# Patient Record
Sex: Male | Born: 1995 | Race: White | Hispanic: No | Marital: Single | Smoking: Never smoker
Health system: Southern US, Community
[De-identification: ages and names within clinical notes are randomized; demographics above are authoritative.]

## PROBLEM LIST (undated history)

## (undated) DIAGNOSIS — F419 Anxiety disorder, unspecified: Secondary | ICD-10-CM

---
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
## 2017-04-27 ENCOUNTER — Emergency Department (HOSPITAL_COMMUNITY): Payer: BLUE CROSS/BLUE SHIELD

## 2017-04-27 ENCOUNTER — Emergency Department (HOSPITAL_COMMUNITY)
Admission: EM | Admit: 2017-04-27 | Discharge: 2017-04-27 | Disposition: A | Discharge status: Home / Self Care | Payer: BLUE CROSS/BLUE SHIELD | Attending: Emergency Medicine | Admitting: Emergency Medicine

## 2017-04-27 ENCOUNTER — Encounter (HOSPITAL_COMMUNITY): Payer: Self-pay | Admitting: Emergency Medicine

## 2017-04-27 DIAGNOSIS — I10 Essential (primary) hypertension: Secondary | ICD-10-CM | POA: Diagnosis not present

## 2017-04-27 DIAGNOSIS — R1011 Right upper quadrant pain: Secondary | ICD-10-CM | POA: Diagnosis present

## 2017-04-27 DIAGNOSIS — R112 Nausea with vomiting, unspecified: Secondary | ICD-10-CM | POA: Diagnosis not present

## 2017-04-27 LAB — URINALYSIS, ROUTINE W REFLEX MICROSCOPIC
BILIRUBIN URINE: NEGATIVE
GLUCOSE, UA: NEGATIVE mg/dL
HGB URINE DIPSTICK: NEGATIVE
KETONES UR: 20 mg/dL — AB
Leukocytes, UA: NEGATIVE
Nitrite: NEGATIVE
PH: 8 (ref 5.0–8.0)
PROTEIN: NEGATIVE mg/dL
Specific Gravity, Urine: 1.021 (ref 1.005–1.030)

## 2017-04-27 LAB — LIPASE, BLOOD: LIPASE: 20 U/L (ref 11–51)

## 2017-04-27 LAB — COMPREHENSIVE METABOLIC PANEL
ALBUMIN: 4.5 g/dL (ref 3.5–5.0)
ALT: 24 U/L (ref 17–63)
AST: 24 U/L (ref 15–41)
Alkaline Phosphatase: 71 U/L (ref 38–126)
Anion gap: 11 (ref 5–15)
BUN: 19 mg/dL (ref 6–20)
CHLORIDE: 102 mmol/L (ref 101–111)
CO2: 24 mmol/L (ref 22–32)
CREATININE: 0.98 mg/dL (ref 0.61–1.24)
Calcium: 9.6 mg/dL (ref 8.9–10.3)
GFR calc Af Amer: >60 mL/min (ref >60)
GLUCOSE: 125 mg/dL — AB (ref 65–99)
POTASSIUM: 3.5 mmol/L (ref 3.5–5.1)
Sodium: 137 mmol/L (ref 135–145)
Total Bilirubin: 0.9 mg/dL (ref 0.3–1.2)
Total Protein: 7.6 g/dL (ref 6.5–8.1)

## 2017-04-27 LAB — RAPID URINE DRUG SCREEN, HOSP PERFORMED
Amphetamines: NOT DETECTED
BARBITURATES: NOT DETECTED
BENZODIAZEPINES: NOT DETECTED
Cocaine: NOT DETECTED
Opiates: NOT DETECTED
Tetrahydrocannabinol: POSITIVE — AB

## 2017-04-27 LAB — CBC
HEMATOCRIT: 41.9 % (ref 39.0–52.0)
Hemoglobin: 14.2 g/dL (ref 13.0–17.0)
MCH: 29.5 pg (ref 26.0–34.0)
MCHC: 33.9 g/dL (ref 30.0–36.0)
MCV: 87.1 fL (ref 78.0–100.0)
PLATELETS: 265 10*3/uL (ref 150–400)
RBC: 4.81 MIL/uL (ref 4.22–5.81)
RDW: 13.3 % (ref 11.5–15.5)
WBC: 10.7 10*3/uL — ABNORMAL HIGH (ref 4.0–10.5)

## 2017-04-27 MED ORDER — SODIUM CHLORIDE 0.9 % IV BOLUS (SEPSIS)
1000.0000 mL | Freq: Once | INTRAVENOUS | Status: AC
  Administered 2017-04-27: 1000 mL via INTRAVENOUS

## 2017-04-27 MED ORDER — ONDANSETRON 4 MG PO TBDP
4.0000 mg | ORAL_TABLET | Freq: Once | ORAL | Status: AC | PRN
  Administered 2017-04-27: 4 mg via ORAL
  Filled 2017-04-27: qty 1

## 2017-04-27 MED ORDER — HYDROCODONE-ACETAMINOPHEN 5-325 MG PO TABS: 1.0000 | ORAL_TABLET | ORAL | 0 refills | Status: AC | PRN

## 2017-04-27 MED ORDER — HYDROMORPHONE HCL 1 MG/ML IJ SOLN
1.0000 mg | Freq: Once | INTRAMUSCULAR | Status: AC
  Administered 2017-04-27: 1 mg via INTRAVENOUS
  Filled 2017-04-27: qty 1

## 2017-04-27 MED ORDER — ONDANSETRON HCL 8 MG PO TABS: 8.0000 mg | ORAL_TABLET | Freq: Three times a day (TID) | ORAL | 0 refills | Status: AC | PRN

## 2017-04-27 MED ORDER — ONDANSETRON HCL 4 MG/2ML IJ SOLN
4.0000 mg | Freq: Once | INTRAMUSCULAR | Status: AC
  Administered 2017-04-27: 4 mg via INTRAVENOUS
  Filled 2017-04-27: qty 2

## 2017-04-27 NOTE — ED Triage Notes (Signed)
Pt complaint of abd cramping with associated n/v onset 0330.

## 2017-04-27 NOTE — ED Provider Notes (Signed)
[facility]-EMERGENCY DEPT Provider Note   CSN: [id number] Arrival date & time: 04/27/17  4098     History   Chief Complaint Chief Complaint  Patient presents with  . Abdominal Pain    HPI [name] is a 21 y.o. male.  The patient presents for evaluation of right upper quadrant abdominal pain which has been recurring over the last 2 weeks.  Currently the pain is been present since last night and is associated with numerous episodes of vomiting, yellow material.  He has not seen any blood in his emesis.  He denies diarrhea, constipation, fever, chills, weakness or dizziness.  No prior similar problems.  The pain in his right upper quadrant is severe rated at 9/10.  No known sick contacts.  No specific treatments tried yet.  There are no other known modifying factors.  HPI  Past Medical History:  Diagnosis Date  . Anxiety     There are no active problems to display for this patient.   History reviewed. No pertinent surgical history.     Home Medications    Prior to Admission medications   Medication Sig Start Date End Date Taking? Authorizing Provider  citalopram (CELEXA) 40 MG tablet Take 40 mg at bedtime by mouth.   Yes [provider]  ibuprofen (ADVIL,MOTRIN) 200 MG tablet Take 400 mg by mouth every 6 (six) hours as needed. For pain.    Yes [provider]    Family History Family History  Problem Relation Age of Onset  . Hypertension Father   . Diabetes Other     Social History Social History   Tobacco Use  . Smoking status: Never Smoker  Substance Use Topics  . Alcohol use: No  . Drug use: No     Allergies   Mold extract [trichophyton] and Pollen extract   Review of Systems Review of Systems  All other systems reviewed and are negative.    Physical Exam Updated Vital Signs BP (!) 142/88   Pulse 65   Temp 98.1 F (36.7 C) (Oral)   Resp 16   Ht 5\' 11"  (1.803 m)   Wt 108.9 kg (240 lb)   SpO2 100%    BMI 33.47 kg/m   Physical Exam  Constitutional: He is oriented to person, place, and time. He appears well-developed and well-nourished. He does not appear ill.  HENT:  Head: Normocephalic and atraumatic.  Right Ear: External ear normal.  Left Ear: External ear normal.  Eyes: Conjunctivae and EOM are normal. Pupils are equal, round, and reactive to light.  Neck: Normal range of motion and phonation normal. Neck supple.  Cardiovascular: Normal rate, regular rhythm and normal heart sounds.  Pulmonary/Chest: Effort normal and breath sounds normal. He exhibits no bony tenderness.  Abdominal: Soft. Bowel sounds are normal. There is no hepatosplenomegaly. There is tenderness (RUQ, mild). There is no rigidity, no rebound, no guarding and no CVA tenderness. Hernia confirmed negative in the ventral area.  Musculoskeletal: Normal range of motion.  Neurological: He is alert and oriented to person, place, and time. No cranial nerve deficit or sensory deficit. He exhibits normal muscle tone. Coordination normal.  Skin: Skin is warm, dry and intact.  Psychiatric: He has a normal mood and affect. His behavior is normal. Judgment and thought content normal.  Nursing note and vitals reviewed.    ED Treatments / Results  Labs (all labs ordered are listed, but only abnormal results are displayed) Labs Reviewed  COMPREHENSIVE METABOLIC PANEL -  Abnormal; Notable for the following components:      Result Value   Glucose, Bld 125 (*)    All other components within normal limits  CBC - Abnormal; Notable for the following components:   WBC 10.7 (*)    All other components within normal limits  URINALYSIS, ROUTINE W REFLEX MICROSCOPIC - Abnormal; Notable for the following components:   Ketones, ur 20 (*)    All other components within normal limits  RAPID URINE DRUG SCREEN, HOSP PERFORMED - Abnormal; Notable for the following components:   Tetrahydrocannabinol POSITIVE (*)    All other components  within normal limits  LIPASE, BLOOD    EKG  EKG Interpretation None       Radiology [place] Abdomen Complete  Result Date: 04/27/2017 CLINICAL DATA:  One week of nausea vomiting and right upper quadrant pain. EXAM: ABDOMEN ULTRASOUND COMPLETE COMPARISON:  None in PACs FINDINGS: Gallbladder: No gallstones or wall thickening visualized. No sonographic Murphy sign noted by sonographer. Common bile duct: Diameter: 2.5 mm Liver: No focal lesion identified. Within normal limits in parenchymal echogenicity. Portal vein is patent on color Doppler imaging with normal direction of blood flow towards the liver. IVC: No abnormality visualized. Pancreas: Visualized portion unremarkable. Spleen: Size and appearance within normal limits. Right Kidney: Length: 11.3 cm. Echogenicity within normal limits. No mass or hydronephrosis visualized. Left Kidney: Length: 11.9 cm. Echogenicity within normal limits. No mass or hydronephrosis visualized. Abdominal aorta: No aneurysm visualized. Other findings: No ascites is observed. IMPRESSION: No gallstones or sonographic evidence of acute cholecystitis. If there are clinical concerns of chronic cholecystitis, a nuclear medicine hepatobiliary scan with gallbladder ejection fraction determination may be useful. No acute intra-abdominal abnormality is observed. Electronically Signed   By: David  SwazilandJordan M.D.   On: 04/27/2017 10:06    Procedures Procedures (including critical care time)  Medications Ordered in ED Medications  ondansetron (ZOFRAN-ODT) disintegrating tablet 4 mg (4 mg Oral Given 04/27/17 0728)  ondansetron (ZOFRAN) injection 4 mg (4 mg Intravenous Given 04/27/17 1050)  sodium chloride 0.9 % bolus 1,000 mL (0 mLs Intravenous Stopped 04/27/17 1117)  HYDROmorphone (DILAUDID) injection 1 mg (1 mg Intravenous Given 04/27/17 1126)     Initial Impression / Assessment and Plan / ED Course  I have reviewed the triage vital signs and the nursing notes.  Pertinent labs  & imaging results that were available during my care of the patient were reviewed by me and considered in my medical decision making (see chart for details).  Clinical Course as of Apr 27 1230  Tue Apr 27, 2017  1229 Substance of abuse present, abnormal Tetrahydrocannabinol: (!) POSITIVE [EW]    Clinical Course User Index [EW] Mancel BaleWentz, Jahmil Macleod, MD     Patient Vitals for the past 24 hrs:  BP Temp Temp src Pulse Resp SpO2 Height Weight  04/27/17 1142 (!) 142/88 - - 65 16 100 % - -  04/27/17 1026 (!) 152/82 - - - - - - -  04/27/17 0724 - - - - - - 5\' 11"  (1.803 m) 108.9 kg (240 lb)  04/27/17 0723 (!) 150/98 98.1 F (36.7 C) Oral 92 18 100 % - -    12:28 PM Reevaluation with update and discussion. After initial assessment and treatment, an updated evaluation reveals he is more comfortable at this time.  He denies use of marijuana or heroin.  Findings discussed with the patient and all questions answered. Adair Lemar L     Final Clinical Impressions(s) / ED Diagnoses  Final diagnoses:  Non-intractable vomiting with nausea, unspecified vomiting type  Hypertension, unspecified type   Nonspecific abdominal pain with vomiting.  Evaluation in the ED is reassuring.  Differential diagnosis includes viral enteritis, and hyperemesis cannabinoid syndrome.  Doubt serious bacterial infection, metabolic instability, pancreatitis, acute intestinal obstruction, or impending vascular collapse.  Mild hypertension is present, but may be related to the painful condition.  Doubt hypertensive urgency.  Nursing Notes Reviewed/ Care Coordinated Applicable Imaging Reviewed Interpretation of Laboratory Data incorporated into ED treatment  The patient appears reasonably screened and/or stabilized for discharge and I doubt any other medical condition or other [facility] requiring further screening, evaluation, or treatment in the ED at this time prior to discharge.  Plan: Home Medications-OTC analgesia as needed;  Home Treatments-gradually advance diet, rest; return here if the recommended treatment, does not improve the symptoms; Recommended follow up-PCP of choice as needed, and get blood pressure checked in 2 weeks.    ED Discharge Orders    None       Mancel BaleWentz, Jordyn Hofacker, MD 04/27/17 1235

## 2017-04-27 NOTE — Discharge Instructions (Signed)
There were no serious causes found to explain your nausea and vomiting.  Start with a clear liquid diet for a day or 2 then gradually advance, to a regular diet.  Do not drive, or go to school when taking the narcotic pain reliever.  Follow-up with a primary care doctor, or return here, as needed, for problems.  Your blood pressure was mildly elevated, we recommend that it be checked again in 2 or 3 weeks to make sure that it is improving.

## 2019-07-21 IMAGING — US US ABDOMEN COMPLETE
1 series · 13 of 25 positions shown · non-contrast
Comparison: None in PACs

CLINICAL DATA: One week of nausea vomiting and right upper quadrant
pain.

EXAM:
ABDOMEN ULTRASOUND COMPLETE

[Series 1: us abdomen complete · 0.25mm/px · 13 of 85 slices shown]
[im 1/85]
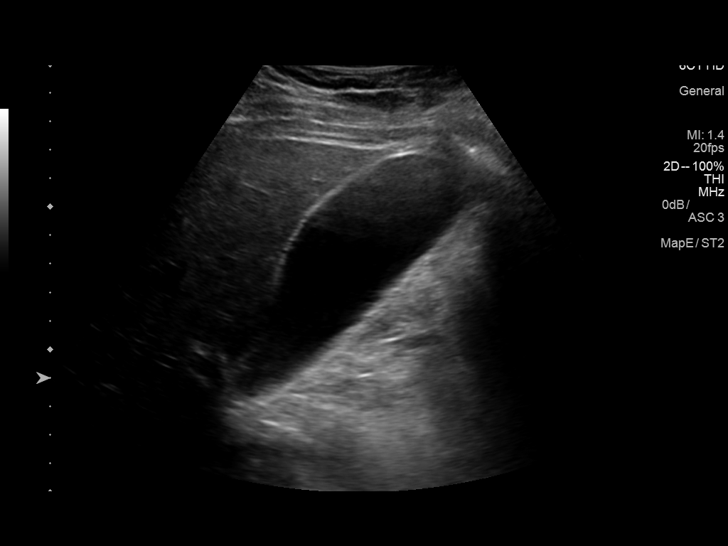
[im 8/85]
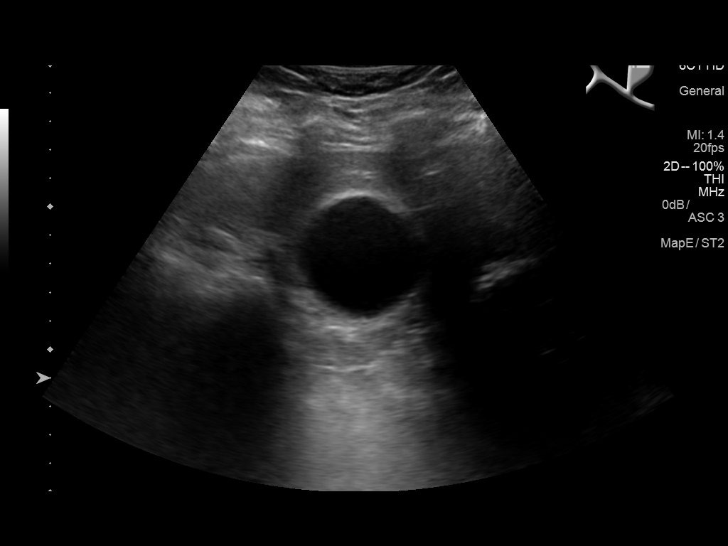
[im 15/85]
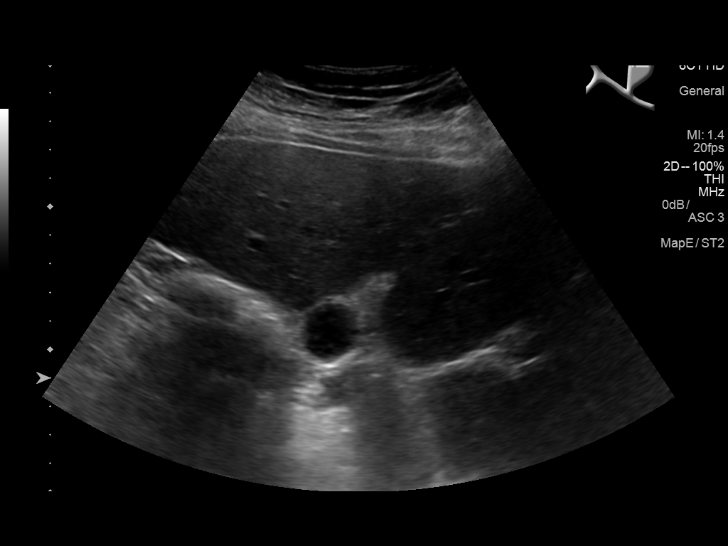
[im 22/85]
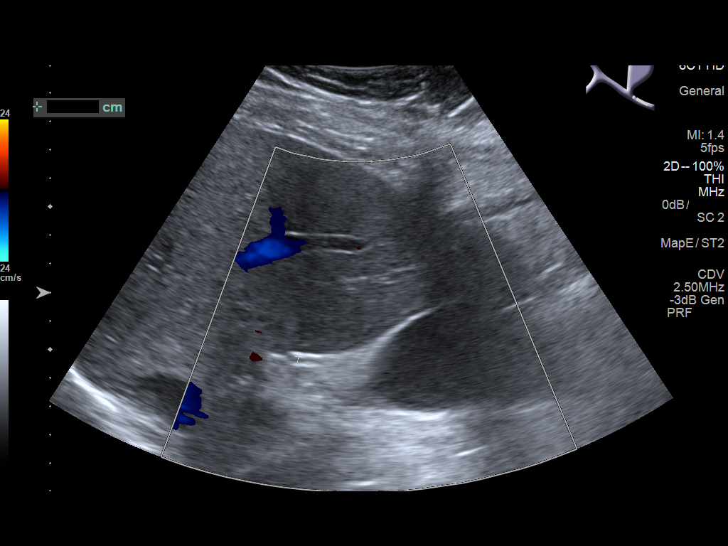
[im 29/85]
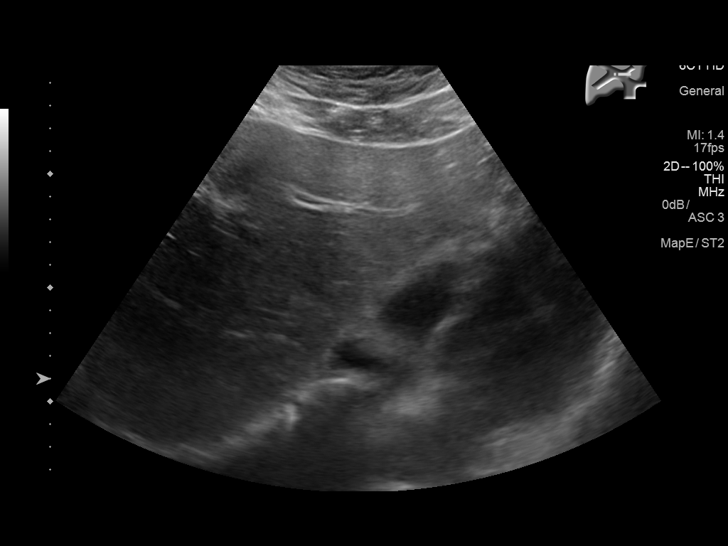
[im 36/85]
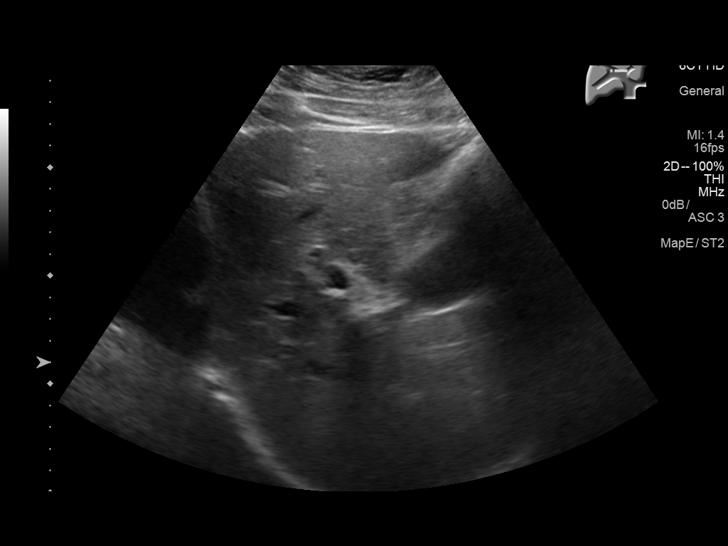
[im 43/85]
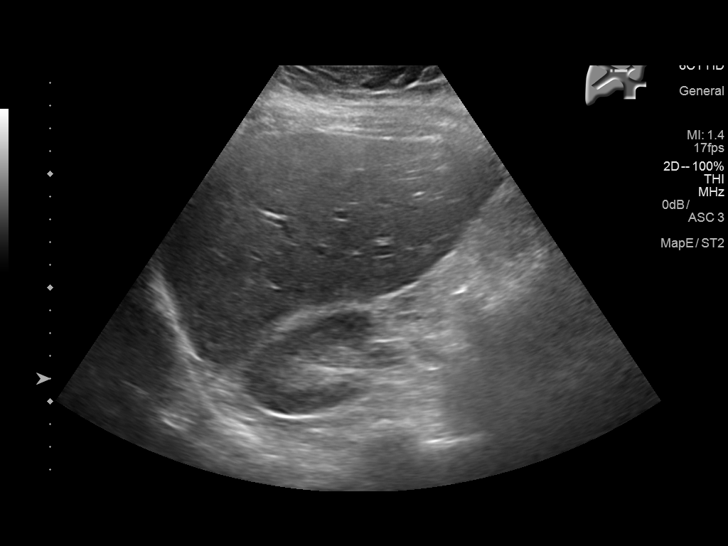
[im 50/85]
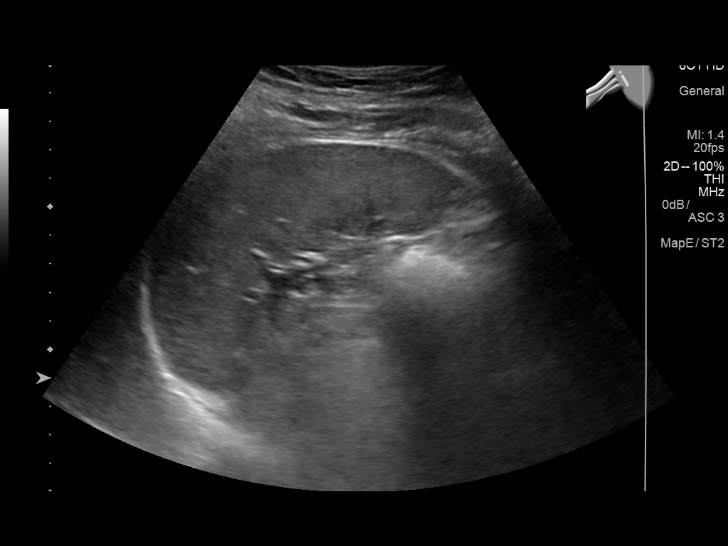
[im 57/85]
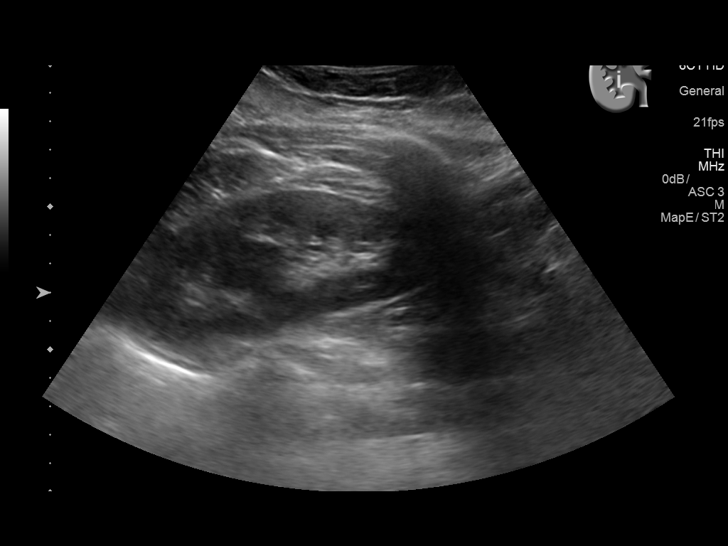
[im 64/85]
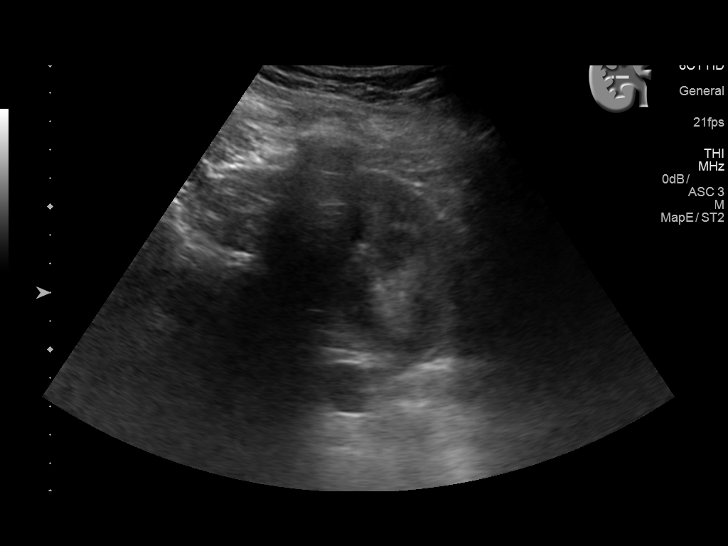
[im 71/85]
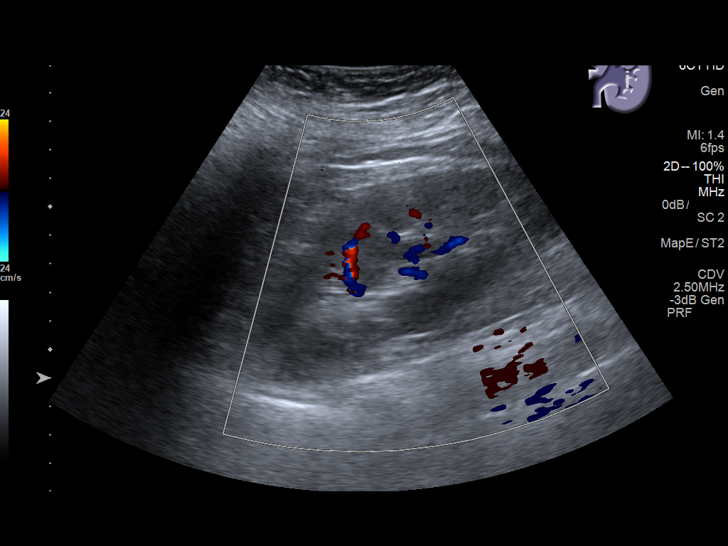
[im 78/85]
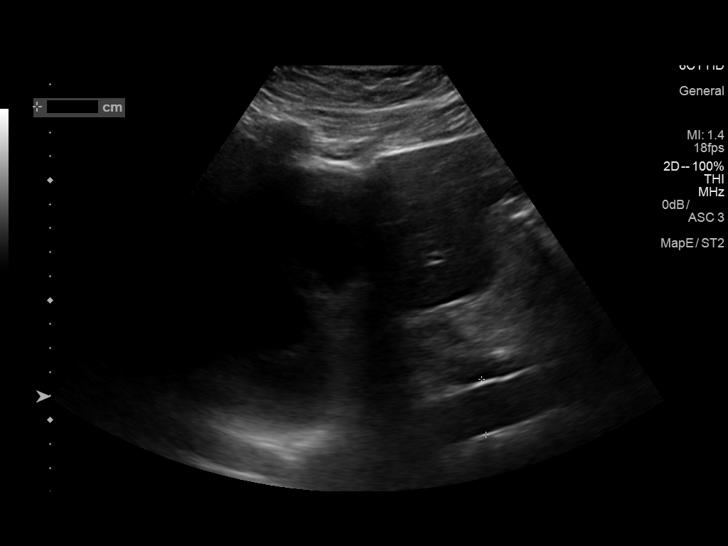
[im 85/85]
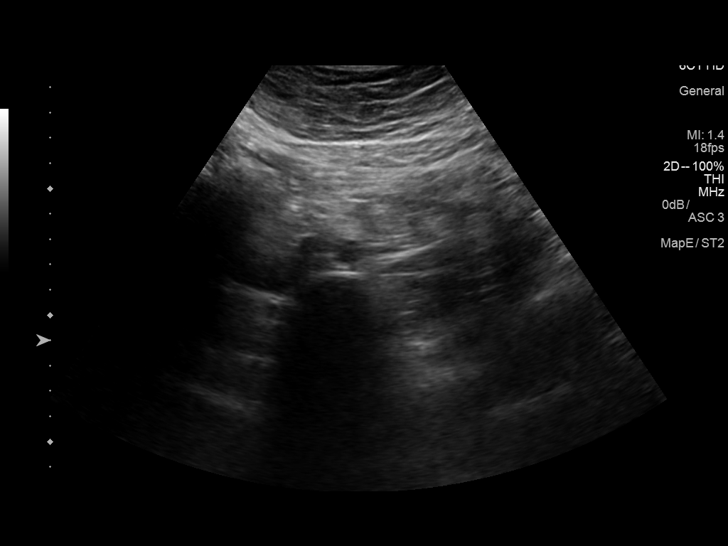

[13 of 25 positions shown; findings below may reference images not displayed]

FINDINGS: Gallbladder: No gallstones or wall thickening visualized. No
sonographic Murphy sign noted by sonographer.

Common bile duct: Diameter: 2.5 mm

Liver: No focal lesion identified. Within normal limits in
parenchymal echogenicity. Portal vein is patent on color Doppler
imaging with normal direction of blood flow towards the liver.

IVC: No abnormality visualized.

Pancreas: Visualized portion unremarkable.

Spleen: Size and appearance within normal limits.

Right Kidney: Length: 11.3 cm. Echogenicity within normal limits. No
mass or hydronephrosis visualized.

Left Kidney: Length: 11.9 cm. Echogenicity within normal limits. No
mass or hydronephrosis visualized.

Abdominal aorta: No aneurysm visualized.

Other findings: No ascites is observed.
IMPRESSION: No gallstones or sonographic evidence of acute cholecystitis. If
there are clinical concerns of chronic cholecystitis, a nuclear
medicine hepatobiliary scan with gallbladder ejection fraction
determination may be useful.

No acute intra-abdominal abnormality is observed.

## 2019-09-22 ENCOUNTER — Ambulatory Visit: Payer: Medicaid Other | Attending: Internal Medicine

## 2019-09-22 DIAGNOSIS — Z23 Encounter for immunization: Secondary | ICD-10-CM

## 2019-09-22 NOTE — Progress Notes (Signed)
   Covid-19 Vaccination Clinic  Name:  [name]    MRN: [id number] DOB: 26-Feb-1996  09/22/2019  [name] was observed post Covid-19 immunization for 15 minutes without incident. He was provided with Vaccine Information Sheet and instruction to access the V-Safe system.   [name] was instructed to call 911 with any severe reactions post vaccine: Marland Kitchen Difficulty breathing  . Swelling of face and throat  . A fast heartbeat  . A bad rash all over body  . Dizziness and weakness   Immunizations Administered    Name Date Dose VIS Date Route   Pfizer COVID-19 Vaccine 09/22/2019  2:22 PM 0.3 mL 06/02/2019 Intramuscular   Manufacturer: [employer], [employer]   Lot: [id number]   NDC: [address]-3

## 2019-10-16 ENCOUNTER — Ambulatory Visit: Payer: Medicaid Other | Attending: Internal Medicine

## 2019-10-16 DIAGNOSIS — Z23 Encounter for immunization: Secondary | ICD-10-CM

## 2019-10-16 NOTE — Progress Notes (Signed)
   Covid-19 Vaccination Clinic  Name:  Jahson Emanuele    MRN: [id number] DOB: 1995/08/30  10/16/2019  Mr. Lopp was observed post Covid-19 immunization for 15 minutes without incident. He was provided with Vaccine Information Sheet and instruction to access the V-Safe system.   Mr. Cid was instructed to call 911 with any severe reactions post vaccine: Marland Kitchen Difficulty breathing  . Swelling of face and throat  . A fast heartbeat  . A bad rash all over body  . Dizziness and weakness   Immunizations Administered    Name Date Dose VIS Date Route   Pfizer COVID-19 Vaccine 10/16/2019  2:25 PM 0.3 mL 08/16/2018 Intramuscular   Manufacturer: [employer], [employer]   Lot: [id number]   NDC: [address]-3
